# Patient Record
Sex: Female | Born: 1935 | Race: White | Hispanic: No | Marital: Married
Health system: Southern US, Community
[De-identification: ages and names within clinical notes are randomized; demographics above are authoritative.]

---

## 2006-04-11 ENCOUNTER — Ambulatory Visit: Payer: Self-pay | Admitting: Internal Medicine

## 2006-07-05 ENCOUNTER — Inpatient Hospital Stay: Payer: Self-pay | Admitting: Surgery

## 2006-07-11 ENCOUNTER — Inpatient Hospital Stay: Payer: Self-pay | Admitting: Surgery

## 2006-07-24 ENCOUNTER — Ambulatory Visit: Payer: Self-pay | Admitting: Gastroenterology

## 2007-02-13 ENCOUNTER — Ambulatory Visit: Payer: Self-pay

## 2007-04-21 ENCOUNTER — Ambulatory Visit: Payer: Self-pay | Admitting: Specialist

## 2007-04-21 ENCOUNTER — Other Ambulatory Visit: Payer: Self-pay

## 2007-05-06 ENCOUNTER — Inpatient Hospital Stay: Payer: Self-pay | Admitting: Specialist

## 2007-06-16 ENCOUNTER — Other Ambulatory Visit: Payer: Self-pay

## 2007-06-16 ENCOUNTER — Emergency Department: Payer: Self-pay | Admitting: Internal Medicine

## 2007-06-18 ENCOUNTER — Ambulatory Visit: Payer: Self-pay

## 2007-10-10 ENCOUNTER — Ambulatory Visit: Payer: Self-pay | Admitting: Internal Medicine

## 2008-09-22 ENCOUNTER — Ambulatory Visit: Payer: Self-pay | Admitting: Family Medicine

## 2008-11-17 ENCOUNTER — Ambulatory Visit (HOSPITAL_COMMUNITY): Admission: RE | Admit: 2008-11-17 | Discharge: 2008-11-17 | Payer: Self-pay | Admitting: Neurology

## 2009-02-03 ENCOUNTER — Emergency Department: Payer: Self-pay | Admitting: Emergency Medicine

## 2010-08-18 ENCOUNTER — Ambulatory Visit: Payer: Self-pay | Admitting: Family Medicine

## 2010-10-17 NOTE — Procedures (Signed)
EEG NUMBER:  [id number].   EEG was done on November 17, 2008.   REFERRING PHYSICIAN:  Rene Kocher, M.D.   CLINICAL HISTORY:  An 77 year old patient being evaluated for syncopal  episode.   This is a 17-channel EEG recorded with the patient awake using standard  10-20 electrode placement.   MEDICATION LISTED:  Levothyroxine and meloxicam.   The background awake rhythm consists of 10-11 Hz alpha, which is of  moderate amplitude, synchronous, reactive to eye opening and closure.  No paroxysmal epileptiform activity, spikes, or sharp waves are noted.  Definite changes of sleep are not noted except minimum drowsiness.  Length of the recording is 22.2 minutes.  Technical component is  average.  EKG tracing reveals regular sinus rhythm.  Hyperventilation  and photic stimulation are both unremarkable.  EKG tracing reveals  regular sinus rhythm.   IMPRESSION:  This EEG performed during awake state is within normal  limits.  No definitive epileptiform features were noted.           ______________________________  Sunny Schlein. Pearlean Brownie, MD     [id number]  D:  11/17/2008 16:32:33  T:  11/18/2008 03:54:04  Job #:  [id number]

## 2011-08-28 ENCOUNTER — Ambulatory Visit: Payer: Self-pay | Admitting: Family Medicine

## 2011-08-29 ENCOUNTER — Ambulatory Visit: Payer: Self-pay | Admitting: Family Medicine

## 2012-03-03 ENCOUNTER — Ambulatory Visit: Payer: Self-pay | Admitting: Family Medicine

## 2012-07-10 ENCOUNTER — Ambulatory Visit: Payer: Self-pay | Admitting: Ophthalmology

## 2012-07-10 LAB — CREATININE, SERUM
Creatinine: 0.83 mg/dL (ref 0.60–1.30)
EGFR (African American): >60

## 2012-09-01 ENCOUNTER — Ambulatory Visit: Payer: Self-pay | Admitting: Family Medicine

## 2012-12-17 ENCOUNTER — Observation Stay: Payer: Self-pay | Admitting: Internal Medicine

## 2012-12-17 LAB — URINALYSIS, COMPLETE
Bilirubin,UR: NEGATIVE
Blood: NEGATIVE
Hyaline Cast: 1
Ketone: NEGATIVE
Nitrite: NEGATIVE
Protein: NEGATIVE
RBC,UR: 4 /HPF (ref 0–5)
Squamous Epithelial: 3
WBC UR: 4 /HPF (ref 0–5)

## 2012-12-17 LAB — TSH: Thyroid Stimulating Horm: 3.29 u[IU]/mL

## 2012-12-17 LAB — TROPONIN I
Troponin-I: <0.02 ng/mL
Troponin-I: <0.02 ng/mL

## 2012-12-17 LAB — HEMOGLOBIN A1C: Hemoglobin A1C: 5.2 % (ref 4.2–6.3)

## 2012-12-17 LAB — CBC
MCHC: 34.6 g/dL (ref 32.0–36.0)
MCV: 87 fL (ref 80–100)
RBC: 5.07 10*6/uL (ref 3.80–5.20)
RDW: 13.4 % (ref 11.5–14.5)

## 2012-12-17 LAB — COMPREHENSIVE METABOLIC PANEL
Albumin: 3.9 g/dL (ref 3.4–5.0)
Alkaline Phosphatase: 86 U/L (ref 50–136)
BUN: 17 mg/dL (ref 7–18)
EGFR (Non-African Amer.): 51 — ABNORMAL LOW
Glucose: 129 mg/dL — ABNORMAL HIGH (ref 65–99)
Osmolality: 264 (ref 275–301)
Potassium: 3.4 mmol/L — ABNORMAL LOW (ref 3.5–5.1)
SGOT(AST): 20 U/L (ref 15–37)
SGPT (ALT): 13 U/L (ref 12–78)
Sodium: 130 mmol/L — ABNORMAL LOW (ref 136–145)
Total Protein: 6.9 g/dL (ref 6.4–8.2)

## 2012-12-17 LAB — LIPID PANEL
HDL Cholesterol: 80 mg/dL — ABNORMAL HIGH (ref 40–60)
Triglycerides: 66 mg/dL (ref 0–200)
VLDL Cholesterol, Calc: 13 mg/dL (ref 5–40)

## 2012-12-17 LAB — PRO B NATRIURETIC PEPTIDE: B-Type Natriuretic Peptide: 194 pg/mL (ref 0–450)

## 2012-12-18 DIAGNOSIS — R55 Syncope and collapse: Secondary | ICD-10-CM

## 2012-12-18 LAB — BASIC METABOLIC PANEL
BUN: 15 mg/dL (ref 7–18)
Calcium, Total: 8.7 mg/dL (ref 8.5–10.1)
Chloride: 102 mmol/L (ref 98–107)
Co2: 29 mmol/L (ref 21–32)
EGFR (Non-African Amer.): 54 — ABNORMAL LOW
Glucose: 100 mg/dL — ABNORMAL HIGH (ref 65–99)
Potassium: 3.4 mmol/L — ABNORMAL LOW (ref 3.5–5.1)
Sodium: 136 mmol/L (ref 136–145)

## 2012-12-18 LAB — CBC WITH DIFFERENTIAL/PLATELET
Basophil #: 0 10*3/uL (ref 0.0–0.1)
Basophil %: 0.3 %
Eosinophil #: 0.2 10*3/uL (ref 0.0–0.7)
HGB: 14.2 g/dL (ref 12.0–16.0)
Lymphocyte %: 23.2 %
MCV: 87 fL (ref 80–100)
Monocyte %: 10.6 %
Neutrophil #: 3.5 10*3/uL (ref 1.4–6.5)
Platelet: 170 10*3/uL (ref 150–440)
RBC: 4.8 10*6/uL (ref 3.80–5.20)
WBC: 5.6 10*3/uL (ref 3.6–11.0)

## 2012-12-18 LAB — URINE CULTURE

## 2012-12-18 LAB — MAGNESIUM: Magnesium: 1.6 mg/dL — ABNORMAL LOW

## 2012-12-31 ENCOUNTER — Emergency Department: Payer: Self-pay | Admitting: Emergency Medicine

## 2014-09-24 NOTE — H&P (Signed)
PATIENT NAME:  Tiffany Downs, Tiffany Downs MR#:  [id number] DATE OF BIRTH:  [birth year]  DATE OF ADMISSION:  12/17/2012  PRIMARY CARE PHYSICIAN:  Dr. Tera Mater.   REQUESTING PHYSICIAN: Dr. Cyril Loosen.   CHIEF COMPLAINT:  Syncope.     HISTORY OF PRESENT ILLNESS: The patient is  an 81 year old female with a known history of hypertension, hypothyroidism and arthritis, is being admitted as needed for syncope. The patient woke up this morning to get out of the bed to go to the bathroom, as she was having bowel movement. While going back to the bedroom, she had a passing out spell.  She slumped down to sit down on the floor.  She did not hit her head, neck or back. She landed on her right hip, after which she was having significant pain issues at a right hip replaced in the past and she was unable to get up.  Her husband called 911 and she was brought into the Emergency Department by EMS. She does report similar syncopal episode about 3 times before about 3 years ago, when she was diagnosed with Meniere's disease and had been on low salt diet and to be doing fine until now. She uses a cane and wheelchair time to time  at home, as per her need, but has been usually okay and is able to manage herself without any difficulty. As of now, she denies any other symptoms at this time, except pain on her right hip, especially when she tries to get up. She has been having some palpitations symptoms since June 11 when her primary care physician prescribed her low-dose Xanax, one at bedtime. She had some trip  [place], [place] for a wedding, when she started having palpitations. At that time, she was taken to urgent care where she was told that she might have had atrial fibrillation, although EMS told her that did not have any A. Fib. She continues to have intermittent of palpitations symptoms since then. Last Thursday, she called her PCP as she was having more symptoms and yesterday her PCPs office called her back and prescribed  her some new depression medication, which she does not recall the name.   She also reports having very fluctuating blood pressures at home.   PAST MEDICAL HISTORY: 1.  Hypertension.  2.  Hypothyroidism.  3.  History of gallstone.  4.  Cataracts.  5.  Arthritis.   PAST SURGICAL HISTORY: 1.  Laminectomy.  2.  Hysterectomy.  3.  Right eye cataract surgery x 2.  4.  Right hip replacement.   SOCIAL HISTORY: No smoking. No alcohol. No IV drugs of abuse. She is married and lives with her husband.   FAMILY HISTORY: Father died at age 65, had a history of cerebrovascular accident.  Mother died at age of 3. They both had gallbladder disease.   ALLERGIES: PENICILLIN.   MEDICATIONS AT HOME: 1.  Calcium with vitamin D 1 tablet p.o. daily.  2.  Citalopram 5 mg p.o. daily.  3.  Hydrochlorothiazide 25 mg p.o. daily.  4.  Levothyroxine 50 mcg p.o. daily.  5.  Meloxicam 7.5 mg p.o. daily.  6.  Prednisone eye drops one each to affected eye once a day.  7.  Vitamin B12 once daily.   REVIEW OF SYSTEMS:  CONSTITUTIONAL: No fever, fatigue, weakness.   EYES: No blurred or double vision.  ENT: No tinnitus or ear pain.  RESPIRATORY: No cough, wheezing, hemoptysis.  CARDIOVASCULAR: Intermittent palpitations, but no chest pain, orthopnea, edema.  GASTROINTESTINAL: No nausea, vomiting, diarrhea.  GENITOURINARY: No dysuria or hematuria.  ENDOCRINE: No polyuria or nocturia.  HEMATOLOGY: No anemia or bruising.  SKIN: No rash or lesion.  MUSCULOSKELETAL: Positive for arthritis.   NEUROLOGIC: Positive for a syncopal spell at least 3 or 4 times in last couple years. PSYCHIATRY: Recently being treated for anxiety and possible depression also.   PHYSICAL EXAMINATION: VITAL SIGNS: Temperature 98.4, heart rate 60 per minute, respirations 18 per minute, blood pressure 176/83, she is saturating 97%.  GENERAL: The patient is an 81 year old female lying in the bed comfortably without any acute distress.   EYES: Pupils equal, round, reactive to light and accommodation. No scleral icterus. Extraocular muscles intact.  HENT:  Head atraumatic, normocephalic. Oropharynx and nasopharynx clear.  NECK: Supple. No jugular venous distention. No thyroid enlargement or tenderness.  LUNGS: Clear to auscultation bilaterally. No wheezing, rales, rhonchi or crepitation.  CARDIOVASCULAR: S1, S2 normal. No murmurs, gallops, rubs.  ABDOMEN:  Soft,  nontender, nondistended. Bowel sounds present. No organomegaly or masses.  EXTREMITIES: No clubbing, cyanosis or edema.  NEUROLOGIC: Cranial nerves II through XII intact.   Muscle strength 5/5 in all extremities.  PSYCHIATRIC: The patient is alert and oriented x 3.  SKIN:  No obvious rash, lesion or ulceration.   LABORATORY, DIAGNOSTIC AND RADIOLOGIC DATA:  Sodium 130, potassium 3.4, chloride 94, CO2 27, BUN 17, creatinine 0.99, and blood sugar 129. Normal liver function tests. Normal first set of cardiac enzymes. Normal TSH. Normal CBC. Negative urinalysis, except trace bacteria, 4 WBCs and 2 leukocyte esterase, but she does not have any urinary symptoms.   Chest x-ray in the ED showed chronic obstructive pulmonary disease, possible atelectasis. Mild loss of height of the body of T9-T10.   Right hip x-ray showed no acute abnormality.   Pelvic x-ray on July 16 showed no acute bony abnormality of the pelvis.   EKG showed no acute ST-T changes.   IMPRESSION AND PLAN: 1. Syncope, likely vasovagal in nature.  We will do serial cardiac enzymes, monitor on telemetry, check TSH, hemoglobin A1c and lipid profile. We will check orthostatic vitals, obtain carotid Doppler and 2-D Echo. We will consult cardiology. Case was discussed with Dr. Juliann Paresallwood.  2.  Hyponatremia, likely due to dehydration. We will hydrate her with IV fluids and monitor her sodium.  3.  Hypokalemia. We will replete and recheck.  4.  Hypothyroidism. We will continue Synthroid. Check TSH.   CODE STATUS:  Full code.   TOTAL TIME SPENT:  Taking care of this patient is 55 minutes.     ____________________________ Ellamae SiaVipul S. Sherryll BurgerShah, MD vss:cc D: 12/17/2012 14:18:00 ET T: 12/17/2012 16:18:38 ET JOB#: [id number]  cc: Buford Bremer S. Sherryll BurgerShah, MD, <Dictator> Rhona LeavensJames F. Burnett ShengHedrick, MD Ellamae SiaVIPUL S [facility] MD ELECTRONICALLY SIGNED 12/22/2012 8:28

## 2014-09-24 NOTE — Discharge Summary (Signed)
PATIENT NAME:  Tiffany Downs, Tiffany Downs MR#:  [id number] DATE OF BIRTH:  Oct 03, 1921  DATE OF ADMISSION:  12/17/2012 DATE OF DISCHARGE:  12/18/2012  DISCHARGE DIAGNOSES:  1.  Vasovagal syncope, negative carotid Dopplers, negative cardiac enzymes, normal echocardiogram, now feeling much better.  2.  Hypokalemia, repleted and resolved.  3.  Hyponatremia, likely due to dehydration, now improved with IV hydration.   SECONDARY DIAGNOSES: 1.  Hypertension.  2.  Hypothyroidism.  3.  History of gallstones. 4.  Cataract. 5.  Arthritis.   CONSULTATION:  1.  Cardiology, Dr. Juliann Pares. 2.  Physical Therapy.   PROCEDURES/RADIOLOGY:  Pelvic x-ray on 07/16 showed no acute bony abnormality of the pelvis.  Right hip x-ray showed no acute bony abnormality.  Chest x-ray showed COPD.  Mild loss of height of the body of T9 or T10.  Chest x-ray on 07/17 showed findings consistent with COPD.  No CHF or pneumonia.  Bilateral carotid Dopplers on 07/17 showed no hemodynamically significant stenosis.  A 2-D echocardiogram within normal limits, official read still pending as well as per verbal report from Dr. Lady Gary.   MAJOR LABORATORY PANEL:  Urinalysis on admission was negative.  Urine culture was contaminated.   HISTORY AND SHORT HOSPITAL COURSE: The patient is an 81 year old female with the above-mentioned medical problems who was admitted for suspected vasovagal syncope. She was ruled out with serial cardiac enzymes. She had carotid Dopplers which were negative. She was found to be hyponatremic with sodium of 130 which was resolved with IV hydration. She also had hypokalemia and hypomagnesemia which was aggressively repleted and resolved. She had a normal TSH, normal hemoglobin A1c, and all other remaining labs remained within normal limits. She also underwent a 2-D echocardiogram which was within normal limits, had a cardiology evaluation by Dr. Juliann Pares who felt this to be more of vasovagal syncope and/or Meniere's  disease and was in agreement with the discharge plan. She was discharged home on the 07/17 in stable condition.   PERTINENT PHYSICAL EXAMINATION ON DATE OF DISCHARGE: VITAL SIGNS: Temperature 98.7, heart rate 61 per minute, respirations 16 per minute, blood pressure 130/61 mmHg. She was saturating 98% on room air. She did have some orthostatic changes with a systolic pressure in lying position of 159 and sitting 143 and standing 118, for which she was recommended slow changing in her position to make sure her body adapts and she does not have more orthostatic symptoms. Her oxygen saturation was 98% on room air.  CARDIOVASCULAR: S1, S2 normal. No murmurs, rubs or gallops.  LUNGS: Clear to auscultation bilaterally. No wheezing, rales, rhonchi or crepitation.  ABDOMEN: Soft, benign.  NEUROLOGICAL: Nonfocal examination. All other physical examination remained at baseline.   DISCHARGE MEDICATIONS: 1.  Levothyroxine 50 mcg p.o. daily.  2.  Meloxicam 7.5 mg p.o. daily.  3.  Hydrochlorothiazide 25 mg p.o. daily. 4.  Escitalopram 5 mg p.o. daily.  5.  Prednisolone ophthalmic eye drops to each eye once a day.  6.  Calcium with vitamin D 1 tablet p.o. daily.  7.  Vitamin B12 once daily.  8.   Buspirone 5 mg po twice daily.  DISCHARGE DIET: Low sodium.   DISCHARGE ACTIVITY: As tolerated.   DISCHARGE INSTRUCTIONS AND FOLLOWUP:  The patient was instructed to follow up with her primary care physician, Dr. Tera Mater, in 1 to 2 weeks. She will need followup with Dr. Maryruth Bun from Psychiatry in 2 to 4 weeks.    TOTAL TIME DISCHARGING THIS PATIENT: 55 minutes.  ____________________________ Ellamae SiaVipul S. Sherryll BurgerShah, MD vss:cb D: 12/18/2012 15:17:29 ET T: 12/18/2012 15:59:11 ET JOB#: [id number]  cc: Azai Gaffin S. Sherryll BurgerShah, MD, <Dictator> Rhona LeavensJames F. Burnett ShengHedrick, MD Doralee AlbinoAarti K. Maryruth BunKapur, MD Dwayne D. Juliann Paresallwood, MD Patricia PesaVIPUL S Meelah Tallo MD ELECTRONICALLY SIGNED 12/22/2012 8:28

## 2014-09-24 NOTE — Consult Note (Signed)
PATIENT NAME:  Tiffany Downs, [name] MR#:  [id number] DATE OF BIRTH:  1922-04-28  DATE OF CONSULTATION:  12/18/2012  REFERRING PHYSICIAN:  Dr. Sherryll BurgerShah. CONSULTING PHYSICIAN:  Len Azeez S. Garnetta BuddyFaheem, MD  HISTORY OF PRESENT ILLNESS:  The patient is an 81 year old, married white female with a history of hypertension and hypothyroidism who was admitted for syncope. The patient was evaluated in the presence of her husband. The patient reported that she passed out after she had a spell and she slumped down to sit on the floor. She did not hit her head, neck or back. The patient reported that she was having anxiety symptoms for the past couple of months. She reported [place]daughter[place], and at that time, she was also experiencing anxiety symptoms. She went to the Urgent Care, and at that time, she was diagnosed with atrial fibrillation. She was taken to a local hospital and they did several tests and told her that she does not have any atrial fibrillation, but actually she is suffering from anxiety. She was initially prescribed alprazolam and advised her not to stop taking the medication suddenly. The patient reported that she [facility], and they continued prescribing her low dose of the Xanax. The patient reported that she continues to have palpitations as well as anxiety including in her abdomen. The patient also has intermittent palpitations last week, also. The patient reported that now she feels that she will have more anxiety, as well as some syncope. She also has a history of some fluctuation in blood pressure at home. She is not feeling anxious at this time. However, she is interested in getting medications to control her anxiety symptoms. Her husband appears supportive at this time. She denied having any mood swings, paranoia or perceptual disturbances.   MEDICAL HISTORY:  Hypertension, hypothyroidism, history of  gallstones, cataracts, arthritis, status post laminectomy, status post hysterectomy, a history of right hip replacement as well as right eye cataract surgery.   SOCIAL HISTORY:  The patient is currently married and lives with her husband. She does not use any drugs or alcohol.   FAMILY HISTORY:  Her father passed away due to cerebrovascular accident and mother passed away at the age of 79.   ALLERGIES:  PENICILLIN.   CURRENT MEDICATIONS:  Calcium with vitamin D. She takes citalopram, which was prescribed by her primary care physician, hydrochlorothiazide, levothyroxine, meloxicam, prednisone eye drops, vitamin B12.   REVIEW OF SYSTEMS: GENERAL:  The patient appeared calm and collected. She denies having any fever, fatigue and weakness.  EYES:  No double or blurred vision.  ENT:  No tinnitus or ear pain.  RESPIRATORY:  No cough, wheezing or hemoptysis.  CARDIOVASCULAR:  A history of intermittent palpitations. No chest pain, orthopnea, edema.  GASTROINTESTINAL:  No nausea, vomiting, diarrhea,  GENITOURINARY:  No hematuria or dysuria.  ENDOCRINE:  No polyuria or nocturia. HEMATOLOGY:  No anemia or bruising.  SKIN:  No rash or lesions.   PHYSICAL EXAMINATION:  VITAL SIGNS:  Temperature 98.4, pulse 56, respiration 18, blood pressure 151/81.  LABORATORY, DIAGNOSTIC, AND RADIOLOGICAL DATA:   Glucose 100, BUN 15, creatinine was 0.93, sodium 136, potassium 3.4, chloride 102, bicarbonate 29 and anion gap 5, troponin 0.02. WBC 5.6, hemoglobin 14.2, hematocrit 41.8, MCV 87.   MENTAL STATUS EXAMINATION:  The patient is an older-looking female who appeared her stated age. She was calm and cooperative. She maintained fair  eye contact. Her speech was normal in tone and volume. Mood was anxious. Affect was congruent. Thought process was logical, goal-directed. Thought content was nondelusional. She currently denied having any suicidal or homicidal ideations or plans. She maintained fair eye contact.    DIAGNOSTIC IMPRESSIONS:  AXIS I:  Anxiety disorder, not otherwise specified.  AXIS II:  None.  AXIS III:  Please review the medical history.   TREATMENT PLAN: 1.  I discussed with the patient about the medications, treatment risks, benefits and alternatives. She is interested in taking the medications for anxiety at this time. I will start her on BuSpar 5 mg p.o. b.i.d.   2.  I will discontinue the citalopram.  3.  The medication was called in to the [facility], and she will pick up the prescription from there.  4.  The patient will follow at [facility] for followup appointment.   Thank you for allowing me in the care of this patient.   ____________________________ Ardeen Fillers. Garnetta Buddy, MD usf:jm D: 12/18/2012 16:14:36 ET T: 12/18/2012 16:51:07 ET JOB#: [id number]  cc: Ardeen Fillers. Garnetta Buddy, MD, <Dictator> Rhunette Croft MD ELECTRONICALLY SIGNED 12/25/2012 15:13

## 2014-09-24 NOTE — Consult Note (Signed)
Brief Consult Note: Diagnosis: Syncope.   Patient was seen by consultant.   Consult note dictated.   Recommend further assessment or treatment.   Orders entered.   Discussed with Attending MD.   Comments: IMP Vaso vagal syncope HTN Hypothyroid HypoK+ Dehydration? Meniers disease DJD . PLAN ROMI Tele Holter as outpt if tele neg Agree with mild hydration Agree with ECHO Carotid dopplers Agree with MRI Consider neurology as outpt Doubt cardiac etiology F/U cardiology as out.  Electronic Signatures: Dorothyann Pengallwood, Dwayne D (MD)  (Signed 17-Jul-14 07:24)  Authored: Brief Consult Note   Last Updated: 17-Jul-14 07:24 by Alwyn Peaallwood, Dwayne D (MD)

## 2014-09-24 NOTE — Consult Note (Signed)
Brief Consult Note: Comments: Came to see pt at the request of Dr Sherryll BurgerShah but she is already discharged. Consult was placed this morning for depression / anxiety.  Electronic Signatures: Rhunette CroftFaheem, Honest Vanleer S (MD)  (Signed 17-Jul-14 14:59)  Authored: Brief Consult Note   Last Updated: 17-Jul-14 14:59 by Rhunette CroftFaheem, Camren Henthorn S (MD)

## 2014-09-24 NOTE — Consult Note (Signed)
Chief Complaint:  Subjective/Chief Complaint Pt states to feel well. She had an episode of brady at rest that was asymtomatic.   VITAL SIGNS/ANCILLARY NOTES: **Vital Signs.:   17-Jul-14 09:11  Vital Signs Type Routine  Temperature Temperature (F) 98.7  Celsius 37  Temperature Source oral  Pulse Pulse 54  Respirations Respirations 17  Systolic BP Systolic BP 465  Diastolic BP (mmHg) Diastolic BP (mmHg) 61  Mean BP 84  Pulse Ox % Pulse Ox % 98  Pulse Ox Activity Level  At rest  Oxygen Delivery Room Air/ 21 %  *Intake and Output.:   17-Jul-14 12:15  Grand Totals Intake:  240 Output:      Net:  240 14 Hr.:  -[id number]  Oral Intake      In:  240  Percentage of Meal Eaten  100   Brief Assessment:  GEN well developed, well nourished, no acute distress   Cardiac Regular  murmur present  -- LE edema  -- JVD  --Rub  --Gallop   Respiratory normal resp effort   Gastrointestinal Normal   Gastrointestinal details normal Bowel sounds normal   EXTR negative cyanosis/clubbing, negative edema   Lab Results: Routine Chem:  17-Jul-14 01:41   Glucose, Serum  100  Creatinine (comp) 0.93  Sodium, Serum 136  Potassium, Serum  3.4  Chloride, Serum 102  CO2, Serum 29  Calcium (Total), Serum 8.7  Anion Gap  5  Osmolality (calc) 273  eGFR (African American) >60  eGFR (Non-African American)  54 (eGFR values <13m/min/1.73 m2 may be an indication of chronic kidney disease (CKD). Calculated eGFR is useful in patients with stable renal function. The eGFR calculation will not be reliable in acutely ill patients when serum creatinine is changing rapidly. It is not useful in  patients on dialysis. The eGFR calculation may not be applicable to patients at the low and high extremes of body sizes, pregnant women, and vegetarians.)  Magnesium, Serum  1.6 (1.8-2.4 THERAPEUTIC RANGE: 4-7 mg/dL TOXIC: > 10 mg/dL  -----------------------)  Cardiac:  17-Jul-14 01:41   Troponin I < 0.02  (0.00-0.05 0.05 ng/mL or less: NEGATIVE  Repeat testing in 3-6 hrs  if clinically indicated. >0.05 ng/mL: POTENTIAL  MYOCARDIAL INJURY. Repeat  testing in 3-6 hrs if  clinically indicated. NOTE: An increase or decrease  of 30% or more on serial  testing suggests a  clinically important change)  Routine Coag:  17-Jul-14 01:41   Prothrombin 13.1  INR 1.0 (INR reference interval applies to patients on anticoagulant therapy. A single INR therapeutic range for coumarins is not optimal for all indications; however, the suggested range for most indications is 2.0 - 3.0. Exceptions to the INR Reference Range may include: Prosthetic heart valves, acute myocardial infarction, prevention of myocardial infarction, and combinations of aspirin and anticoagulant. The need for a higher or lower target INR must be assessed individually. Reference: The Pharmacology and Management of the Vitamin K  antagonists: the seventh ACCP Conference on Antithrombotic and Thrombolytic Therapy. CXNTZG.0174Sept:126 (3suppl): [id number] A HCT value >55% may artifactually increase the PT.  In one study,  the increase was an average of 25%. Reference:  "Effect on Routine and Special Coagulation Testing Values of Citrate Anticoagulant Adjustment in Patients with High HCT Values." American Journal of Clinical Pathology 2006;126:400-405.)  Routine Hem:  17-Jul-14 01:41   WBC (CBC) 5.6  RBC (CBC) 4.80  Hemoglobin (CBC) 14.2  Hematocrit (CBC) 41.8  Platelet Count (CBC) 170  MCV 87  MCH 29.7  MCHC 34.1  RDW 13.6  Neutrophil % 63.1  Lymphocyte % 23.2  Monocyte % 10.6  Eosinophil % 2.8  Basophil % 0.3  Neutrophil # 3.5  Lymphocyte # 1.3  Monocyte # 0.6  Eosinophil # 0.2  Basophil # 0.0 (Result(s) reported on 18 Dec 2012 at 02:16AM.)   Radiology Results: XRay:    16-Jul-14 09:30, Chest PA and Lateral  Chest PA and Lateral   REASON FOR EXAM:    syncope  COMMENTS:   May transport without cardiac  monitor    PROCEDURE: DXR - DXR CHEST PA (OR AP) AND LATERAL  - Dec 17 2012  9:30AM     RESULT: The lungs are mildly hyperinflated. There is no focal infiltrate.   Minimal subsegmental atelectasis in the lateral costophrenic gutter is   suspected. The cardiac silhouette is mildly enlarged. The pulmonary   vascularity is not engorged. The thoracic vertebral bodies are   osteopenic. There is mild loss of height of approximately the T9 or T10.    IMPRESSION:   1. There is mild hyperinflation which suggest COPD.  2. I cannot exclude low-grade compensated CHF. Minimal atelectasis in   left costophrenic gutter is suspected.  3. There is mild loss of height of the body of T9 or T10.     Dictation Site: 2        Verified By: DAVID A. Martinique, M.D., MD    16-Jul-14 09:30, Hip Right Complete  Hip Right Complete   REASON FOR EXAM:    fall  COMMENTS:       PROCEDURE: DXR - DXR HIP RIGHT COMPLETE  - Dec 17 2012  9:30AM     RESULT: AP and lateral views of the prosthetic right hip reveal no   evidence of fracture nor dislocation. There are no findings to suggest   loosening or infection.    IMPRESSION:  There is no acute abnormality of the prosthetic right hip   joint.     Dictation Site: 2      Verified By: DAVID A. Martinique, M.D., MD    16-Jul-14 09:30, Pelvis AP Only  Pelvis AP Only   REASON FOR EXAM:    fall  COMMENTS:       PROCEDURE: DXR - DXR PELVIS AP ONLY  - Dec 17 2012  9:30AM     RESULT: The bony pelvis is osteopenic. There is no evidence of an acute   fracture. There is narrowing of the left hip joint space. There is a   prosthetic right hip joint. The sacrum and SI joints are grossly normal.   A spina bifida occulta at L5 is present.    IMPRESSION:  There is no acute bony abnormality of the pelvis.     Dictation Site: 2      Verified By: DAVID A. Martinique, M.D., MD    17-Jul-14 08:02, Chest PA and Lateral  Chest PA and Lateral   REASON FOR EXAM:     syncope  COMMENTS:       PROCEDURE: DXR - DXR CHEST PA (OR AP) AND LATERAL  - Dec 18 2012  8:02AM     RESULT: The lungs are mildly hyperinflated. There is no focal infiltrate.   The cardiac silhouette is top normal in size. There is mild tortuosity of   the descending thoracic aorta. The mediastinum is normal in width. There   is no significant pleural fluid collection but there is minimal  blunting   of the posterior costophrenic  angles.    IMPRESSION:  The findings are consistent with COPD. There is no evidence   of CHF nor of pneumonia. A trace of pleural fluid in the costophrenic   gutters cannot be excluded.   Dictation Site: 2        Verified By: DAVID A. Martinique, M.D., MD  [place]:    17-Jul-14 11:43, US Carotid Doppler Bilateral  US Carotid Doppler Bilateral   REASON FOR EXAM:    syncope  COMMENTS:       PROCEDURE: [place]  - US CAROTID DOPPLER BILATERAL  - Dec 18 2012 11:43AM     RESULT: Grayscale and color flow Doppler techniques were employed to   evaluate the cervical carotid arteries.    On the right thereis calcified plaque in the bulb and proximal ICA as   well as in the CCA. The waveform patterns and color flow images do not   suggest significant stenosis. On the right the peak internal carotid   systolic velocity measured 74 cm/sec and the peak common carotid velocity   measured 78 cm/sec corresponding to a normal ratio of approximately one.    On the left there is a small amount of calcified plaque at the carotid     bulb and proximal ICA. The waveform patterns and color flow images do not   suggest significant stenotic lesions. On the left the peak internal   carotid systolic velocity measured 56 cm/sec and the peak common carotid   velocity measured 86 cm/sec corresponding to a normal ratio of 0.7.    The vertebral arteries are normal in flow direction bilaterally.    IMPRESSION:  There are small amounts of calcified plaque bilaterally. I   do not see evidence  of a hemodynamically significant carotid stenosis.     Dictation Site: 2        Verified By: DAVID A. Martinique, M.D., MD   Assessment/Plan:  Invasive Device Daily Assessment of Necessity:  Does the patient currently have any of the following indwelling devices? none   Assessment/Plan:  Assessment IMP Syncope unclear etiology? Vasovagal syncope ? Mild dehydration Anxiety Bradycardia Hx HTN DJD .   Plan PLAN Ambulate in halls Outpt Holter to evaluate brady Relax Sodium restriction to 1500-2000 mg a day Consider discontinuing HCTZ / switch to a different Bp med Consider support stockings She may need a benzo prn for anxiety No clear indication for PPM at this point F/U with Fath 1-2 weeks Place holter 1-2 days in the office ECHO was basically normal Carotid dopplers were also unremarkable   Electronic Signatures: Lujean Amel D (MD)  (Signed 17-Jul-14 16:45)  Authored: Chief Complaint, VITAL SIGNS/ANCILLARY NOTES, Brief Assessment, Lab Results, Radiology Results, Assessment/Plan   Last Updated: 17-Jul-14 16:45 by Yolonda Kida (MD)

## 2017-06-04 DEATH — deceased

## 2019-02-02 IMAGING — CR DG CHOLANGIOGRAM OPERATIVE
1 series · 1 of 1 positions shown · non-contrast
Comparison: none

REASON FOR EXAM: Post-op
COMMENTS:

PROCEDURE:     DXR - DXR CHOLANGIOGRAM OP (INITIAL)  - July 11, 2006  [DATE]
RESULT:     There is opacification of the common bile duct.  The duct is
distended.  The contrast column leads to an area of concave deformity
distally consistent with a distal obstruction, possibly a retained stone.

[imported/digitized images]
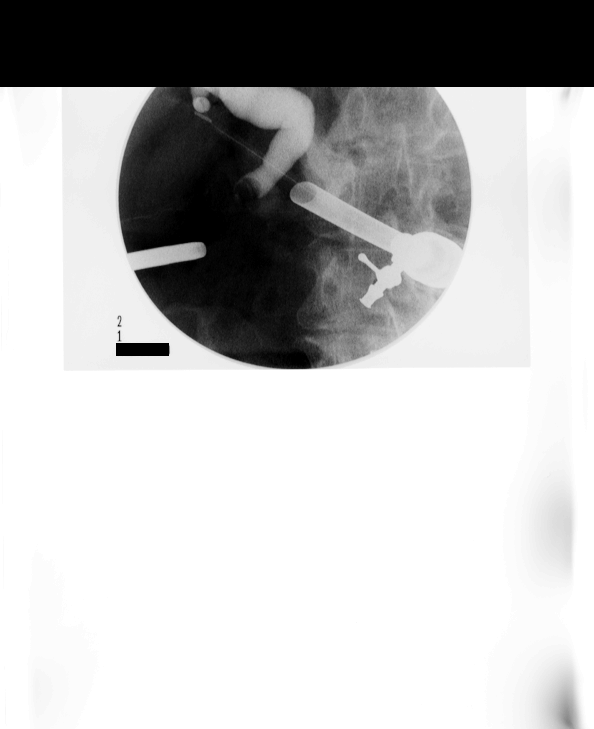

[1 of 1 positions shown; findings below may reference images not displayed]

IMPRESSION: 1.     Abnormal operative cholangiogram with findings suggestive of common
bile duct obstruction distally possibly secondary to a retained stone.
